# Patient Record
Sex: Female | Born: 1935 | Race: White | Hispanic: No
Health system: Southern US, Community
[De-identification: ages and names within clinical notes are randomized; demographics above are authoritative.]

---

## 2004-11-06 ENCOUNTER — Ambulatory Visit: Payer: Self-pay

## 2010-08-20 ENCOUNTER — Emergency Department: Payer: Self-pay | Admitting: Emergency Medicine

## 2010-10-06 ENCOUNTER — Ambulatory Visit: Payer: Self-pay | Admitting: Oncology

## 2010-10-10 ENCOUNTER — Ambulatory Visit: Payer: Self-pay | Admitting: Anesthesiology

## 2010-10-14 ENCOUNTER — Ambulatory Visit: Payer: Self-pay | Admitting: Urology

## 2010-10-16 ENCOUNTER — Ambulatory Visit: Payer: Self-pay | Admitting: Oncology

## 2010-10-23 ENCOUNTER — Ambulatory Visit: Payer: Self-pay | Admitting: Oncology

## 2010-11-06 ENCOUNTER — Ambulatory Visit: Payer: Self-pay | Admitting: Oncology

## 2010-12-05 ENCOUNTER — Ambulatory Visit: Payer: Self-pay | Admitting: Vascular Surgery

## 2010-12-05 ENCOUNTER — Ambulatory Visit: Payer: Self-pay | Admitting: Oncology

## 2010-12-10 ENCOUNTER — Inpatient Hospital Stay: Payer: Self-pay | Admitting: Internal Medicine

## 2010-12-17 ENCOUNTER — Ambulatory Visit: Payer: Self-pay | Admitting: Oncology

## 2011-01-05 ENCOUNTER — Ambulatory Visit: Payer: Self-pay | Admitting: Oncology

## 2011-02-04 ENCOUNTER — Ambulatory Visit: Payer: Self-pay | Admitting: Oncology

## 2011-03-07 ENCOUNTER — Ambulatory Visit: Payer: Self-pay | Admitting: Oncology

## 2011-03-17 ENCOUNTER — Ambulatory Visit: Payer: Self-pay | Admitting: Vascular Surgery

## 2011-04-06 ENCOUNTER — Ambulatory Visit: Payer: Self-pay | Admitting: Oncology

## 2011-05-07 ENCOUNTER — Ambulatory Visit: Payer: Self-pay | Admitting: Oncology

## 2011-06-07 ENCOUNTER — Ambulatory Visit: Payer: Self-pay | Admitting: Oncology

## 2011-07-07 ENCOUNTER — Ambulatory Visit: Payer: Self-pay | Admitting: Oncology

## 2011-08-07 ENCOUNTER — Ambulatory Visit: Payer: Self-pay | Admitting: Oncology

## 2011-09-06 ENCOUNTER — Ambulatory Visit: Payer: Self-pay | Admitting: Oncology

## 2011-10-07 ENCOUNTER — Ambulatory Visit: Payer: Self-pay | Admitting: Oncology

## 2011-10-24 LAB — URINALYSIS, COMPLETE
Bacteria: NEGATIVE
Glucose,UR: NEGATIVE mg/dL (ref 0–75)
Leukocyte Esterase: NEGATIVE
Nitrite: NEGATIVE

## 2011-10-24 LAB — COMPREHENSIVE METABOLIC PANEL
Albumin: 3.6 g/dL (ref 3.4–5.0)
Anion Gap: 8 (ref 7–16)
BUN: 13 mg/dL (ref 7–18)
Calcium, Total: 9.2 mg/dL (ref 8.5–10.1)
EGFR (Non-African Amer.): 54 — ABNORMAL LOW
Glucose: 90 mg/dL (ref 65–99)
Osmolality: 281 (ref 275–301)
Potassium: 4 mmol/L (ref 3.5–5.1)
SGOT(AST): 20 U/L (ref 15–37)
SGPT (ALT): 15 U/L
Sodium: 141 mmol/L (ref 136–145)

## 2011-10-24 LAB — CBC CANCER CENTER
Basophil %: 0.4 %
Eosinophil #: 0.1 x10 3/mm (ref 0.0–0.7)
Eosinophil %: 1.6 %
HCT: 38.1 % (ref 35.0–47.0)
HGB: 12.7 g/dL (ref 12.0–16.0)
Lymphocyte #: 1.5 x10 3/mm (ref 1.0–3.6)
Lymphocyte %: 28.3 %
MCH: 32.1 pg (ref 26.0–34.0)
MCV: 95.9 fL (ref 80–100)
Monocyte #: 0.5 x10 3/mm (ref 0.0–0.7)
Monocyte %: 8.8 %
Neutrophil #: 3.2 x10 3/mm (ref 1.4–6.5)
RBC: 3.97 10*6/uL (ref 3.80–5.20)
RDW: 13.3 % (ref 11.5–14.5)
WBC: 5.3 x10 3/mm (ref 3.6–11.0)

## 2011-11-07 ENCOUNTER — Ambulatory Visit: Payer: Self-pay | Admitting: Oncology

## 2011-11-14 LAB — COMPREHENSIVE METABOLIC PANEL
Alkaline Phosphatase: 90 U/L (ref 50–136)
Anion Gap: 6 — ABNORMAL LOW (ref 7–16)
Calcium, Total: 8.6 mg/dL (ref 8.5–10.1)
Chloride: 103 mmol/L (ref 98–107)
Co2: 30 mmol/L (ref 21–32)
Creatinine: 1.16 mg/dL (ref 0.60–1.30)
EGFR (African American): 59 — ABNORMAL LOW
EGFR (Non-African Amer.): 48 — ABNORMAL LOW
Glucose: 137 mg/dL — ABNORMAL HIGH (ref 65–99)
SGPT (ALT): 17 U/L

## 2011-11-14 LAB — CBC CANCER CENTER
Basophil %: 0.2 %
Eosinophil %: 1.8 %
HCT: 40.6 % (ref 35.0–47.0)
HGB: 13.2 g/dL (ref 12.0–16.0)
Lymphocyte #: 1 x10 3/mm (ref 1.0–3.6)
MCV: 94.3 fL (ref 80–100)
Monocyte %: 7.6 %
Neutrophil #: 4.2 x10 3/mm (ref 1.4–6.5)
RBC: 4.3 10*6/uL (ref 3.80–5.20)
WBC: 5.7 x10 3/mm (ref 3.6–11.0)

## 2011-12-05 ENCOUNTER — Ambulatory Visit: Payer: Self-pay | Admitting: Oncology

## 2011-12-09 ENCOUNTER — Inpatient Hospital Stay: Payer: Self-pay | Admitting: Oncology

## 2011-12-09 LAB — URINALYSIS, COMPLETE
Bacteria: NONE SEEN
Bilirubin,UR: NEGATIVE
Leukocyte Esterase: NEGATIVE
Ph: 5 (ref 4.5–8.0)
Protein: 100
RBC,UR: 1790 /HPF (ref 0–5)
Specific Gravity: 1.027 (ref 1.003–1.030)
Squamous Epithelial: NONE SEEN

## 2011-12-09 LAB — CBC CANCER CENTER
Basophil %: 0.3 %
Eosinophil #: 0.1 x10 3/mm (ref 0.0–0.7)
HCT: 36.2 % (ref 35.0–47.0)
HGB: 12.2 g/dL (ref 12.0–16.0)
Lymphocyte %: 16 %
MCHC: 33.6 g/dL (ref 32.0–36.0)
Monocyte #: 0.7 x10 3/mm (ref 0.0–0.7)
Monocyte %: 9.8 %
Neutrophil %: 72.5 %
Platelet: 328 x10 3/mm (ref 150–440)
RBC: 4.06 10*6/uL (ref 3.80–5.20)

## 2011-12-09 LAB — COMPREHENSIVE METABOLIC PANEL
Anion Gap: 11 (ref 7–16)
BUN: 20 mg/dL — ABNORMAL HIGH (ref 7–18)
Bilirubin,Total: 0.5 mg/dL (ref 0.2–1.0)
Chloride: 101 mmol/L (ref 98–107)
Co2: 29 mmol/L (ref 21–32)
Creatinine: 1.48 mg/dL — ABNORMAL HIGH (ref 0.60–1.30)
Osmolality: 283 (ref 275–301)
Potassium: 3.4 mmol/L — ABNORMAL LOW (ref 3.5–5.1)
Sodium: 141 mmol/L (ref 136–145)
Total Protein: 6.8 g/dL (ref 6.4–8.2)

## 2011-12-10 LAB — PROTIME-INR: INR: 1.1

## 2011-12-10 LAB — CBC WITH DIFFERENTIAL/PLATELET
Eosinophil %: 0.1 %
HCT: 35.2 % (ref 35.0–47.0)
HGB: 11.9 g/dL — ABNORMAL LOW (ref 12.0–16.0)
Lymphocyte #: 0.6 10*3/uL — ABNORMAL LOW (ref 1.0–3.6)
Lymphocyte %: 10.3 %
MCH: 29.8 pg (ref 26.0–34.0)
MCV: 89 fL (ref 80–100)
Monocyte %: 1.6 %
Neutrophil #: 4.8 10*3/uL (ref 1.4–6.5)
RBC: 3.98 10*6/uL (ref 3.80–5.20)
RDW: 13.9 % (ref 11.5–14.5)
WBC: 5.5 10*3/uL (ref 3.6–11.0)

## 2011-12-10 LAB — BASIC METABOLIC PANEL
Anion Gap: 15 (ref 7–16)
BUN: 20 mg/dL — ABNORMAL HIGH (ref 7–18)
Calcium, Total: 8.3 mg/dL — ABNORMAL LOW (ref 8.5–10.1)
Co2: 24 mmol/L (ref 21–32)
Creatinine: 1.44 mg/dL — ABNORMAL HIGH (ref 0.60–1.30)
EGFR (African American): 46 — ABNORMAL LOW
EGFR (Non-African Amer.): 38 — ABNORMAL LOW
Glucose: 128 mg/dL — ABNORMAL HIGH (ref 65–99)
Osmolality: 287 (ref 275–301)
Sodium: 142 mmol/L (ref 136–145)

## 2011-12-10 LAB — APTT: Activated PTT: 34 secs (ref 23.6–35.9)

## 2011-12-12 LAB — CBC WITH DIFFERENTIAL/PLATELET
Basophil %: 0.1 %
Eosinophil #: 0.1 10*3/uL (ref 0.0–0.7)
HCT: 31.3 % — ABNORMAL LOW (ref 35.0–47.0)
HGB: 10.5 g/dL — ABNORMAL LOW (ref 12.0–16.0)
MCH: 30 pg (ref 26.0–34.0)
MCHC: 33.4 g/dL (ref 32.0–36.0)
MCV: 90 fL (ref 80–100)
Monocyte #: 0.9 10*3/uL — ABNORMAL HIGH (ref 0.0–0.7)
Neutrophil #: 5.4 10*3/uL (ref 1.4–6.5)
Neutrophil %: 75.9 %
RDW: 14 % (ref 11.5–14.5)

## 2011-12-12 LAB — BASIC METABOLIC PANEL
Chloride: 110 mmol/L — ABNORMAL HIGH (ref 98–107)
Co2: 28 mmol/L (ref 21–32)
Glucose: 86 mg/dL (ref 65–99)
Potassium: 3.1 mmol/L — ABNORMAL LOW (ref 3.5–5.1)
Sodium: 144 mmol/L (ref 136–145)

## 2011-12-13 LAB — BASIC METABOLIC PANEL
Calcium, Total: 7.9 mg/dL — ABNORMAL LOW (ref 8.5–10.1)
Chloride: 107 mmol/L (ref 98–107)
Co2: 26 mmol/L (ref 21–32)
Creatinine: 1.18 mg/dL (ref 0.60–1.30)
EGFR (Non-African Amer.): 47 — ABNORMAL LOW
Osmolality: 292 (ref 275–301)
Potassium: 3.1 mmol/L — ABNORMAL LOW (ref 3.5–5.1)
Sodium: 146 mmol/L — ABNORMAL HIGH (ref 136–145)

## 2011-12-15 ENCOUNTER — Ambulatory Visit: Payer: Self-pay | Admitting: Oncology

## 2011-12-19 LAB — COMPREHENSIVE METABOLIC PANEL
Alkaline Phosphatase: 91 U/L (ref 50–136)
Anion Gap: 7 (ref 7–16)
BUN: 12 mg/dL (ref 7–18)
Co2: 34 mmol/L — ABNORMAL HIGH (ref 21–32)
Creatinine: 0.95 mg/dL (ref 0.60–1.30)
Osmolality: 277 (ref 275–301)
SGPT (ALT): 19 U/L
Total Protein: 5.7 g/dL — ABNORMAL LOW (ref 6.4–8.2)

## 2011-12-19 LAB — CBC CANCER CENTER
Basophil #: 0 x10 3/mm (ref 0.0–0.1)
Eosinophil #: 0.1 x10 3/mm (ref 0.0–0.7)
HCT: 33.4 % — ABNORMAL LOW (ref 35.0–47.0)
Lymphocyte #: 0.6 x10 3/mm — ABNORMAL LOW (ref 1.0–3.6)
MCH: 29.8 pg (ref 26.0–34.0)
MCHC: 33.7 g/dL (ref 32.0–36.0)
MCV: 88.3 fL (ref 80–100)
Monocyte #: 0.6 x10 3/mm (ref 0.0–0.7)
Monocyte %: 9.2 %
Neutrophil #: 5.7 x10 3/mm (ref 1.4–6.5)
RBC: 3.78 10*6/uL — ABNORMAL LOW (ref 3.80–5.20)
RDW: 13.8 % (ref 11.5–14.5)
WBC: 7 x10 3/mm (ref 3.6–11.0)

## 2011-12-26 LAB — CBC CANCER CENTER
Eosinophil #: 0.2 x10 3/mm (ref 0.0–0.7)
Eosinophil %: 2.7 %
HCT: 32.9 % — ABNORMAL LOW (ref 35.0–47.0)
Lymphocyte #: 0.4 x10 3/mm — ABNORMAL LOW (ref 1.0–3.6)
MCH: 29.7 pg (ref 26.0–34.0)
Monocyte #: 0 x10 3/mm (ref 0.0–0.7)
Neutrophil #: 6.5 x10 3/mm (ref 1.4–6.5)
Neutrophil %: 90.5 %
Platelet: 130 x10 3/mm — ABNORMAL LOW (ref 150–440)
RBC: 3.68 10*6/uL — ABNORMAL LOW (ref 3.80–5.20)

## 2011-12-26 LAB — URINALYSIS, COMPLETE
Bilirubin,UR: NEGATIVE
Ph: 8 (ref 4.5–8.0)
Protein: 100

## 2011-12-26 LAB — COMPREHENSIVE METABOLIC PANEL
BUN: 20 mg/dL — ABNORMAL HIGH (ref 7–18)
Bilirubin,Total: 0.6 mg/dL (ref 0.2–1.0)
Creatinine: 1.07 mg/dL (ref 0.60–1.30)
EGFR (African American): >60
EGFR (Non-African Amer.): 53 — ABNORMAL LOW
Glucose: 138 mg/dL — ABNORMAL HIGH (ref 65–99)
Osmolality: 282 (ref 275–301)
Potassium: 3.3 mmol/L — ABNORMAL LOW (ref 3.5–5.1)
SGOT(AST): 28 U/L (ref 15–37)
SGPT (ALT): 32 U/L
Sodium: 139 mmol/L (ref 136–145)
Total Protein: 5.6 g/dL — ABNORMAL LOW (ref 6.4–8.2)

## 2011-12-26 LAB — MAGNESIUM: Magnesium: 1.6 mg/dL — ABNORMAL LOW

## 2011-12-29 LAB — CBC CANCER CENTER
Basophil #: 0.1 x10 3/mm (ref 0.0–0.1)
Eosinophil %: 3.5 %
HCT: 27.5 % — ABNORMAL LOW (ref 35.0–47.0)
Lymphocyte %: 15.7 %
MCHC: 33.4 g/dL (ref 32.0–36.0)
MCV: 87.6 fL (ref 80–100)
Monocyte #: 0.3 x10 3/mm (ref 0.0–0.7)
Neutrophil #: 2.4 x10 3/mm (ref 1.4–6.5)
Neutrophil %: 71 %
Platelet: 107 x10 3/mm — ABNORMAL LOW (ref 150–440)
WBC: 3.4 x10 3/mm — ABNORMAL LOW (ref 3.6–11.0)

## 2012-01-05 ENCOUNTER — Ambulatory Visit: Payer: Self-pay | Admitting: Oncology

## 2012-01-06 LAB — HEPATIC FUNCTION PANEL A (ARMC)
Albumin: 2.7 g/dL — ABNORMAL LOW (ref 3.4–5.0)
Alkaline Phosphatase: 121 U/L (ref 50–136)
Bilirubin, Direct: 0.1 mg/dL (ref 0.00–0.20)
SGPT (ALT): 19 U/L
Total Protein: 6 g/dL — ABNORMAL LOW (ref 6.4–8.2)

## 2012-01-06 LAB — BASIC METABOLIC PANEL
BUN: 17 mg/dL (ref 7–18)
Calcium, Total: 8.4 mg/dL — ABNORMAL LOW (ref 8.5–10.1)
Chloride: 101 mmol/L (ref 98–107)
Co2: 32 mmol/L (ref 21–32)
EGFR (African American): >60
EGFR (Non-African Amer.): 57 — ABNORMAL LOW
Osmolality: 277 (ref 275–301)
Potassium: 3.7 mmol/L (ref 3.5–5.1)
Sodium: 138 mmol/L (ref 136–145)

## 2012-01-06 LAB — CBC CANCER CENTER
Basophil %: 1.1 %
Eosinophil #: 0 x10 3/mm (ref 0.0–0.7)
Eosinophil %: 0.4 %
HCT: 25.6 % — ABNORMAL LOW (ref 35.0–47.0)
MCH: 29.5 pg (ref 26.0–34.0)
Monocyte #: 0.5 x10 3/mm (ref 0.0–0.7)
Monocyte %: 15.9 %
Neutrophil %: 60.9 %
RBC: 2.92 10*6/uL — ABNORMAL LOW (ref 3.80–5.20)
WBC: 2.9 x10 3/mm — ABNORMAL LOW (ref 3.6–11.0)

## 2012-01-07 ENCOUNTER — Ambulatory Visit: Payer: Self-pay | Admitting: Oncology

## 2012-01-16 LAB — CBC CANCER CENTER
Basophil #: 0 x10 3/mm (ref 0.0–0.1)
Basophil %: 0.6 %
HGB: 8.3 g/dL — ABNORMAL LOW (ref 12.0–16.0)
Lymphocyte #: 0.8 x10 3/mm — ABNORMAL LOW (ref 1.0–3.6)
Monocyte #: 0.7 x10 3/mm (ref 0.2–0.9)
Neutrophil %: 68.8 %
Platelet: 188 x10 3/mm (ref 150–440)
RBC: 2.79 10*6/uL — ABNORMAL LOW (ref 3.80–5.20)
RDW: 18.3 % — ABNORMAL HIGH (ref 11.5–14.5)

## 2012-01-16 LAB — COMPREHENSIVE METABOLIC PANEL
Calcium, Total: 9 mg/dL (ref 8.5–10.1)
Chloride: 101 mmol/L (ref 98–107)
Co2: 28 mmol/L (ref 21–32)
Creatinine: 0.95 mg/dL (ref 0.60–1.30)
EGFR (African American): >60
SGOT(AST): 13 U/L — ABNORMAL LOW (ref 15–37)
SGPT (ALT): 15 U/L
Sodium: 136 mmol/L (ref 136–145)
Total Protein: 6.1 g/dL — ABNORMAL LOW (ref 6.4–8.2)

## 2012-01-23 LAB — CBC CANCER CENTER
Basophil #: 0 x10 3/mm (ref 0.0–0.1)
Basophil %: 1.5 %
Eosinophil #: 0.1 x10 3/mm (ref 0.0–0.7)
Eosinophil %: 1.8 %
HGB: 8.3 g/dL — ABNORMAL LOW (ref 12.0–16.0)
Lymphocyte #: 0.7 x10 3/mm — ABNORMAL LOW (ref 1.0–3.6)
MCH: 29.7 pg (ref 26.0–34.0)
MCHC: 33.2 g/dL (ref 32.0–36.0)
MCV: 90 fL (ref 80–100)
Monocyte #: 0.2 x10 3/mm (ref 0.2–0.9)
Monocyte %: 4.5 %
Neutrophil #: 2.4 x10 3/mm (ref 1.4–6.5)
Platelet: 151 x10 3/mm (ref 150–440)
RBC: 2.78 10*6/uL — ABNORMAL LOW (ref 3.80–5.20)
RDW: 18.3 % — ABNORMAL HIGH (ref 11.5–14.5)

## 2012-01-23 LAB — BASIC METABOLIC PANEL
BUN: 19 mg/dL — ABNORMAL HIGH (ref 7–18)
Co2: 29 mmol/L (ref 21–32)
Creatinine: 0.9 mg/dL (ref 0.60–1.30)
EGFR (African American): >60
EGFR (Non-African Amer.): >60
Osmolality: 270 (ref 275–301)
Sodium: 132 mmol/L — ABNORMAL LOW (ref 136–145)

## 2012-02-04 ENCOUNTER — Ambulatory Visit: Payer: Self-pay | Admitting: Oncology

## 2012-02-06 LAB — CBC CANCER CENTER
Lymphocyte #: 0.9 x10 3/mm — ABNORMAL LOW (ref 1.0–3.6)
Monocyte #: 0.7 x10 3/mm (ref 0.2–0.9)
Monocyte %: 15.4 %
Neutrophil #: 2.8 x10 3/mm (ref 1.4–6.5)
Platelet: 124 x10 3/mm — ABNORMAL LOW (ref 150–440)
RBC: 2.54 10*6/uL — ABNORMAL LOW (ref 3.80–5.20)

## 2012-02-06 LAB — COMPREHENSIVE METABOLIC PANEL
Albumin: 2.6 g/dL — ABNORMAL LOW (ref 3.4–5.0)
Alkaline Phosphatase: 102 U/L (ref 50–136)
Anion Gap: 6 — ABNORMAL LOW (ref 7–16)
BUN: 9 mg/dL (ref 7–18)
Creatinine: 0.78 mg/dL (ref 0.60–1.30)
Glucose: 99 mg/dL (ref 65–99)
SGPT (ALT): 14 U/L
Sodium: 135 mmol/L — ABNORMAL LOW (ref 136–145)
Total Protein: 5.8 g/dL — ABNORMAL LOW (ref 6.4–8.2)

## 2012-02-13 LAB — CBC CANCER CENTER
Basophil %: 0.5 %
Eosinophil #: 0.1 x10 3/mm (ref 0.0–0.7)
HCT: 33.3 % — ABNORMAL LOW (ref 35.0–47.0)
HGB: 10.8 g/dL — ABNORMAL LOW (ref 12.0–16.0)
Lymphocyte #: 0.8 x10 3/mm — ABNORMAL LOW (ref 1.0–3.6)
MCH: 30.2 pg (ref 26.0–34.0)
MCHC: 32.4 g/dL (ref 32.0–36.0)
MCV: 93 fL (ref 80–100)
Monocyte #: 0.5 x10 3/mm (ref 0.2–0.9)
Monocyte %: 9.6 %
Neutrophil #: 3.9 x10 3/mm (ref 1.4–6.5)
Neutrophil %: 73.7 %
Platelet: 207 x10 3/mm (ref 150–440)
RDW: 21.3 % — ABNORMAL HIGH (ref 11.5–14.5)
WBC: 5.3 x10 3/mm (ref 3.6–11.0)

## 2012-02-13 LAB — BASIC METABOLIC PANEL
Anion Gap: 6 — ABNORMAL LOW (ref 7–16)
Calcium, Total: 8.8 mg/dL (ref 8.5–10.1)
Chloride: 100 mmol/L (ref 98–107)
EGFR (African American): >60
EGFR (Non-African Amer.): 53 — ABNORMAL LOW
Glucose: 115 mg/dL — ABNORMAL HIGH (ref 65–99)
Osmolality: 274 (ref 275–301)
Potassium: 4.1 mmol/L (ref 3.5–5.1)
Sodium: 136 mmol/L (ref 136–145)

## 2012-02-20 LAB — CBC CANCER CENTER
Basophil %: 0.4 %
HCT: 30.8 % — ABNORMAL LOW (ref 35.0–47.0)
Lymphocyte #: 0.7 x10 3/mm — ABNORMAL LOW (ref 1.0–3.6)
Lymphocyte %: 16.3 %
MCH: 30.6 pg (ref 26.0–34.0)
MCHC: 33.3 g/dL (ref 32.0–36.0)
Monocyte #: 0.3 x10 3/mm (ref 0.2–0.9)
Monocyte %: 7.4 %
Neutrophil #: 3 x10 3/mm (ref 1.4–6.5)
Neutrophil %: 74.5 %
WBC: 4.1 x10 3/mm (ref 3.6–11.0)

## 2012-02-27 LAB — CBC CANCER CENTER
Basophil %: 0.5 %
Eosinophil %: 0.4 %
HCT: 29.4 % — ABNORMAL LOW (ref 35.0–47.0)
HGB: 9.8 g/dL — ABNORMAL LOW (ref 12.0–16.0)
Lymphocyte #: 0.5 x10 3/mm — ABNORMAL LOW (ref 1.0–3.6)
Lymphocyte %: 20.3 %
MCH: 30.8 pg (ref 26.0–34.0)
MCHC: 33.4 g/dL (ref 32.0–36.0)
MCV: 92 fL (ref 80–100)
Monocyte #: 0.4 x10 3/mm (ref 0.2–0.9)
Monocyte %: 15.8 %
Neutrophil #: 1.7 x10 3/mm (ref 1.4–6.5)
RBC: 3.18 10*6/uL — ABNORMAL LOW (ref 3.80–5.20)
RDW: 19.5 % — ABNORMAL HIGH (ref 11.5–14.5)
WBC: 2.6 x10 3/mm — ABNORMAL LOW (ref 3.6–11.0)

## 2012-02-27 LAB — HEPATIC FUNCTION PANEL A (ARMC)
Albumin: 2.7 g/dL — ABNORMAL LOW (ref 3.4–5.0)
Alkaline Phosphatase: 75 U/L (ref 50–136)
Bilirubin,Total: 0.4 mg/dL (ref 0.2–1.0)
Total Protein: 6.1 g/dL — ABNORMAL LOW (ref 6.4–8.2)

## 2012-02-27 LAB — BASIC METABOLIC PANEL
Anion Gap: 6 — ABNORMAL LOW (ref 7–16)
BUN: 12 mg/dL (ref 7–18)
Calcium, Total: 8.7 mg/dL (ref 8.5–10.1)
Chloride: 99 mmol/L (ref 98–107)
Creatinine: 0.81 mg/dL (ref 0.60–1.30)
EGFR (African American): >60
EGFR (Non-African Amer.): >60
Osmolality: 272 (ref 275–301)

## 2012-03-05 LAB — CBC CANCER CENTER
Eosinophil #: 0 x10 3/mm (ref 0.0–0.7)
Eosinophil %: 0.2 %
HCT: 27.7 % — ABNORMAL LOW (ref 35.0–47.0)
Lymphocyte #: 0.5 x10 3/mm — ABNORMAL LOW (ref 1.0–3.6)
MCHC: 32.7 g/dL (ref 32.0–36.0)
Monocyte #: 0.7 x10 3/mm (ref 0.2–0.9)
Monocyte %: 14 %
Neutrophil %: 74.9 %
Platelet: 138 x10 3/mm — ABNORMAL LOW (ref 150–440)
RBC: 2.98 10*6/uL — ABNORMAL LOW (ref 3.80–5.20)
RDW: 20.3 % — ABNORMAL HIGH (ref 11.5–14.5)
WBC: 5.1 x10 3/mm (ref 3.6–11.0)

## 2012-03-05 LAB — BASIC METABOLIC PANEL
Calcium, Total: 8.6 mg/dL (ref 8.5–10.1)
Creatinine: 0.83 mg/dL (ref 0.60–1.30)
EGFR (African American): >60
EGFR (Non-African Amer.): >60
Glucose: 110 mg/dL — ABNORMAL HIGH (ref 65–99)
Sodium: 135 mmol/L — ABNORMAL LOW (ref 136–145)

## 2012-03-05 LAB — PROTIME-INR
INR: 1.1
Prothrombin Time: 14.8 secs — ABNORMAL HIGH (ref 11.5–14.7)

## 2012-03-06 ENCOUNTER — Ambulatory Visit: Payer: Self-pay | Admitting: Oncology

## 2012-03-08 ENCOUNTER — Ambulatory Visit: Payer: Self-pay | Admitting: Oncology

## 2012-03-11 ENCOUNTER — Ambulatory Visit: Payer: Self-pay | Admitting: Oncology

## 2012-03-12 LAB — CBC CANCER CENTER
Basophil #: 0 x10 3/mm (ref 0.0–0.1)
Eosinophil %: 0.2 %
HCT: 25.4 % — ABNORMAL LOW (ref 35.0–47.0)
Lymphocyte %: 14.3 %
MCV: 92 fL (ref 80–100)
Monocyte #: 0.2 x10 3/mm (ref 0.2–0.9)
Neutrophil #: 4.1 x10 3/mm (ref 1.4–6.5)
Neutrophil %: 81.6 %
RBC: 2.76 10*6/uL — ABNORMAL LOW (ref 3.80–5.20)
RDW: 19.9 % — ABNORMAL HIGH (ref 11.5–14.5)

## 2012-03-19 LAB — CBC CANCER CENTER
Eosinophil %: 0.3 %
HCT: 24.5 % — ABNORMAL LOW (ref 35.0–47.0)
Lymphocyte #: 0.3 x10 3/mm — ABNORMAL LOW (ref 1.0–3.6)
Lymphocyte %: 17.4 %
MCH: 30.9 pg (ref 26.0–34.0)
MCHC: 33.2 g/dL (ref 32.0–36.0)
Neutrophil #: 1.2 x10 3/mm — ABNORMAL LOW (ref 1.4–6.5)
Platelet: 165 x10 3/mm (ref 150–440)

## 2012-03-19 LAB — BASIC METABOLIC PANEL
BUN: 12 mg/dL (ref 7–18)
Calcium, Total: 8.6 mg/dL (ref 8.5–10.1)
Chloride: 97 mmol/L — ABNORMAL LOW (ref 98–107)
Co2: 30 mmol/L (ref 21–32)
EGFR (Non-African Amer.): >60
Sodium: 135 mmol/L — ABNORMAL LOW (ref 136–145)

## 2012-03-19 LAB — HEPATIC FUNCTION PANEL A (ARMC)
Alkaline Phosphatase: 67 U/L (ref 50–136)
SGOT(AST): 16 U/L (ref 15–37)
Total Protein: 5.8 g/dL — ABNORMAL LOW (ref 6.4–8.2)

## 2012-03-26 LAB — CBC CANCER CENTER
Basophil #: 0 x10 3/mm (ref 0.0–0.1)
Eosinophil #: 0 x10 3/mm (ref 0.0–0.7)
Eosinophil %: 0.1 %
HCT: 23.3 % — ABNORMAL LOW (ref 35.0–47.0)
HGB: 7.6 g/dL — ABNORMAL LOW (ref 12.0–16.0)
Lymphocyte #: 0.6 x10 3/mm — ABNORMAL LOW (ref 1.0–3.6)
MCH: 30.8 pg (ref 26.0–34.0)
Monocyte #: 0.7 x10 3/mm (ref 0.2–0.9)
Neutrophil #: 4.6 x10 3/mm (ref 1.4–6.5)
RBC: 2.48 10*6/uL — ABNORMAL LOW (ref 3.80–5.20)
RDW: 20.9 % — ABNORMAL HIGH (ref 11.5–14.5)

## 2012-03-26 LAB — COMPREHENSIVE METABOLIC PANEL
Albumin: 2.5 g/dL — ABNORMAL LOW (ref 3.4–5.0)
Alkaline Phosphatase: 89 U/L (ref 50–136)
Anion Gap: 9 (ref 7–16)
BUN: 13 mg/dL (ref 7–18)
Chloride: 96 mmol/L — ABNORMAL LOW (ref 98–107)
Co2: 27 mmol/L (ref 21–32)
Creatinine: 1.15 mg/dL (ref 0.60–1.30)
EGFR (African American): 53 — ABNORMAL LOW
EGFR (Non-African Amer.): 46 — ABNORMAL LOW
Glucose: 118 mg/dL — ABNORMAL HIGH (ref 65–99)
Osmolality: 266 (ref 275–301)
Potassium: 3.6 mmol/L (ref 3.5–5.1)
SGOT(AST): 12 U/L — ABNORMAL LOW (ref 15–37)
SGPT (ALT): 15 U/L
Sodium: 132 mmol/L — ABNORMAL LOW (ref 136–145)

## 2012-04-05 ENCOUNTER — Ambulatory Visit: Payer: Self-pay | Admitting: Oncology

## 2012-04-12 LAB — CBC CANCER CENTER
Basophil #: 0 x10 3/mm (ref 0.0–0.1)
Eosinophil #: 0 x10 3/mm (ref 0.0–0.7)
Eosinophil %: 0 %
HCT: 28.3 % — ABNORMAL LOW (ref 35.0–47.0)
Lymphocyte %: 6.5 %
Monocyte %: 9 %
Neutrophil #: 14.2 x10 3/mm — ABNORMAL HIGH (ref 1.4–6.5)
Neutrophil %: 84.4 %
Platelet: 100 x10 3/mm — ABNORMAL LOW (ref 150–440)
RDW: 18.7 % — ABNORMAL HIGH (ref 11.5–14.5)
WBC: 16.8 x10 3/mm — ABNORMAL HIGH (ref 3.6–11.0)

## 2012-04-12 LAB — COMPREHENSIVE METABOLIC PANEL
Albumin: 2.9 g/dL — ABNORMAL LOW (ref 3.4–5.0)
Alkaline Phosphatase: 111 U/L (ref 50–136)
Anion Gap: 11 (ref 7–16)
Bilirubin,Total: 0.4 mg/dL (ref 0.2–1.0)
Calcium, Total: 9 mg/dL (ref 8.5–10.1)
Chloride: 90 mmol/L — ABNORMAL LOW (ref 98–107)
Co2: 26 mmol/L (ref 21–32)
Creatinine: 2.78 mg/dL — ABNORMAL HIGH (ref 0.60–1.30)
EGFR (African American): 18 — ABNORMAL LOW
EGFR (Non-African Amer.): 16 — ABNORMAL LOW
Osmolality: 265 (ref 275–301)
Potassium: 4.4 mmol/L (ref 3.5–5.1)
Sodium: 127 mmol/L — ABNORMAL LOW (ref 136–145)

## 2012-04-12 LAB — PROTIME-INR
INR: 1.2
Prothrombin Time: 15.6 secs — ABNORMAL HIGH (ref 11.5–14.7)

## 2012-05-06 ENCOUNTER — Ambulatory Visit: Payer: Self-pay | Admitting: Oncology

## 2012-05-06 DEATH — deceased

## 2012-09-19 IMAGING — XA IR NEPHROSTOMY TUBE CHANGE
3 series · 3 of 3 positions shown · non-contrast
Comparison: none

REASON FOR EXAM: per Radiologist Hydronephrosis
COMMENTS:

[Series 2: single · 1 of 1 slices shown (1 of 2)]
[im 1/1]
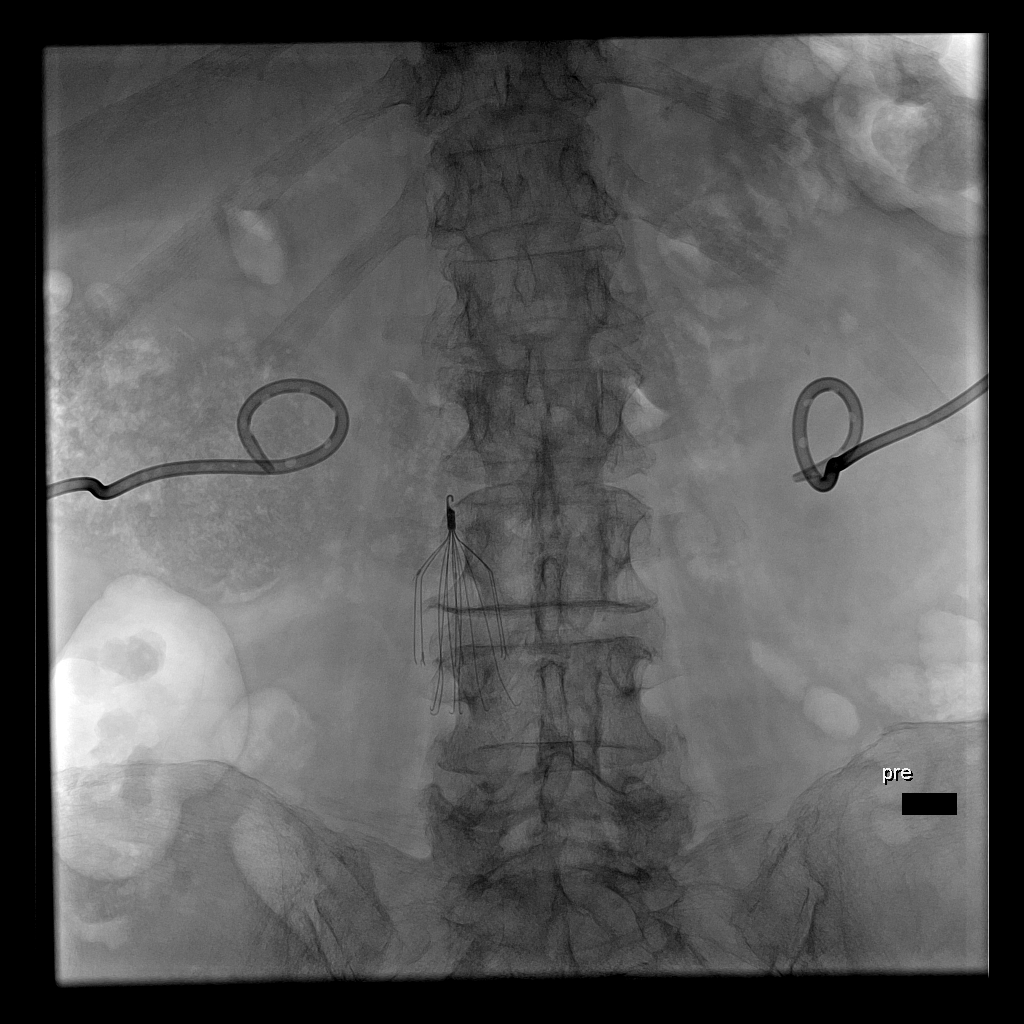

[Series 3: single · 1 of 1 slices shown (2 of 2)]
[im 1/1]
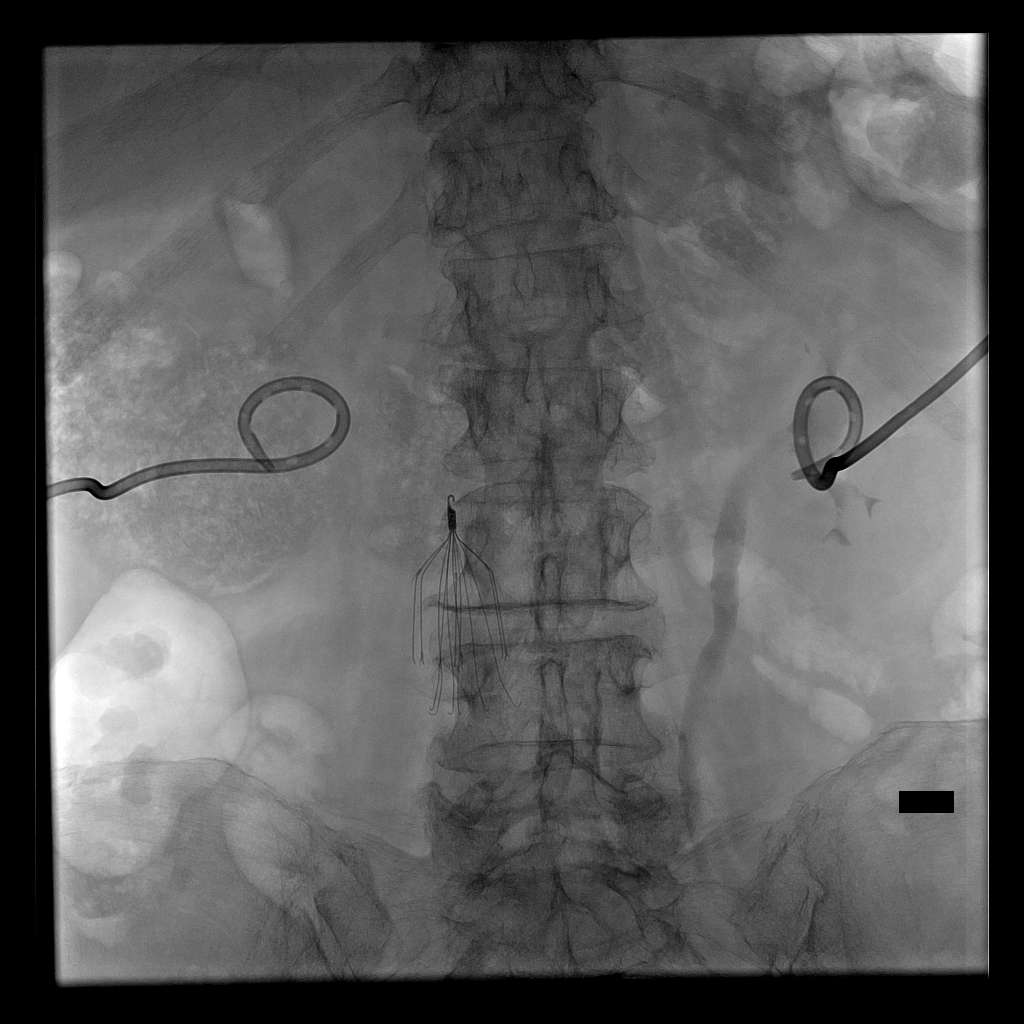

[Series 5: fl - angio · 1 of 1 slices shown]
[im 1/1]
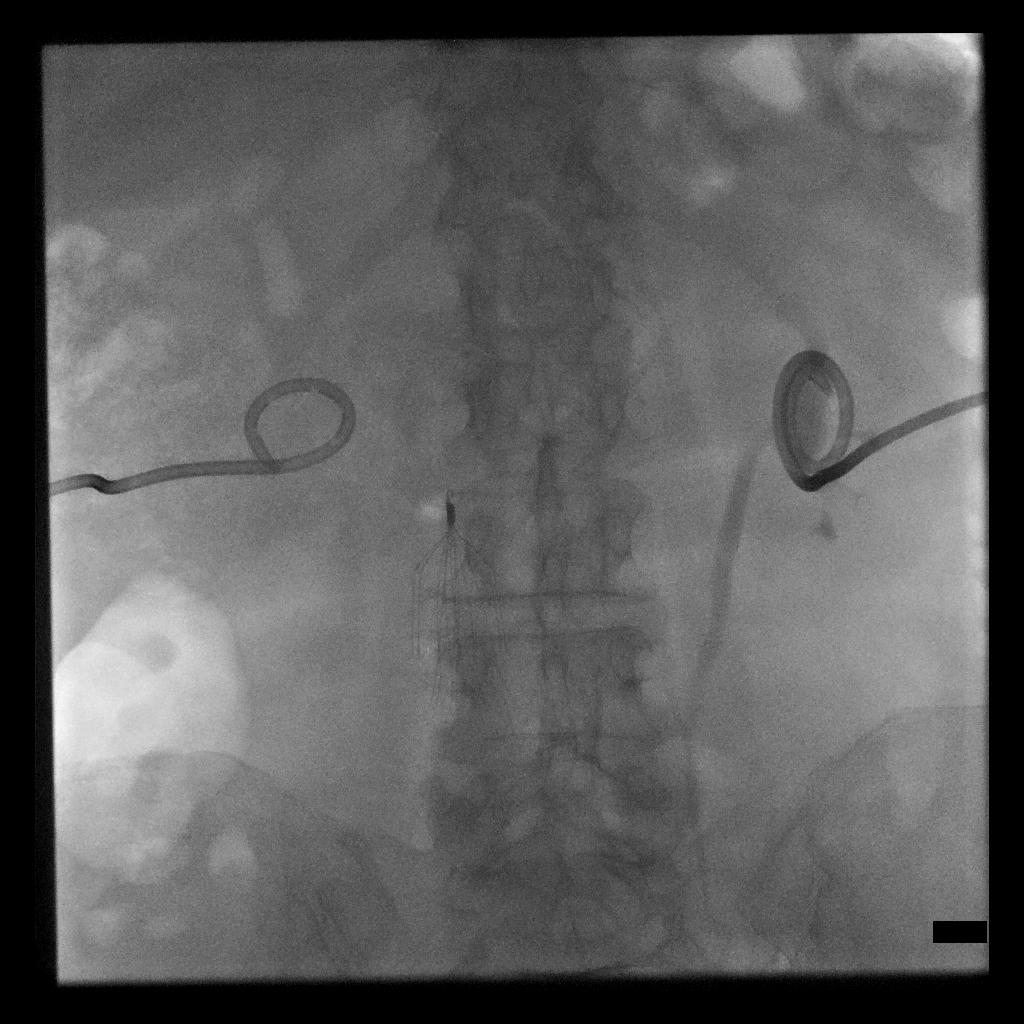

[3 of 3 positions shown; findings below may reference images not displayed]

PROCEDURE:     VAS - CHG NEPHROSTOMY/PYELOSTOMY TUBE  - March 11, 2012  [DATE]

RESULT:

PROCEDURE:  The patient was informed of the risks and benefits of the
procedure and proper informed consent was obtained. The patient was brought
to the [facility] and placed in a prone position. The left
nephrostomy tube is identified. The overlying soft tissues were prepped and
draped in the usual sterile fashion. The patient received conscious sedation
via IV doses of 50 mcg of Fentanyl and 2 mg of Versed. The Versed was
administered at [DATE] and the Fentanyl at [DATE]. The 10 French
nephrostomy tube was uncoped. An antegrade urogram was performed status post
injection of a solution of 10 ml sterile saline and 5 mm radiopaque
contrast. Prior to this study, the patient was premedicated for contrast
reaction. The contrast mixture used was the lowest dosage possible and still
maintain opacification. The present nephrostomy tube is appreciated in
appropriate positioning. There is mild dilatation of the urinary collecting
system. The catheter was cannulated with an Amplatz guidewire and removed. A
10 French multipurpose drainage catheter was placed into the urinary
collecting system with the tip coped in the pelvis. Appropriate placement
confirmed status post administration of radiopaque contrast. The catheter
was secured with sterile Stat-lock and sterile dressing placed at the site
and the catheter was attached to drainage bag. The patient tolerated the
procedure without complications.
IMPRESSION: Left sided nephrostomy tube exchange as described above.
The patient tolerated the procedure without complications.

## 2015-01-28 NOTE — Discharge Summary (Signed)
PATIENT NAME:  Brittney Adams, Brittney Adams MR#:  [id number] DATE OF BIRTH:  11/22/1932  DATE OF ADMISSION:  12/09/2011 DATE OF DISCHARGE:  12/14/2011  DIAGNOSES: 1. Bilateral hydronephrosis secondary to recurrent and progressing carcinoma of bladder status post percutaneous nephrostomy tube done.  2. Increasing lower abdominal pain secondary to metastatic cancer.  3. Renal failure secondary to bilateral hydronephrosis.   DISCHARGE MEDICATIONS:  1. Gabapentin 300 mg 1 tablet 2 times a day.  2. Fentanyl patch 50 mcg every 72 hours. 3. Vicodin 1 tablet q.4 hours p.r.n. for breakthrough pain.  4. Lorazepam 0.5 mg 3 times a day as needed.  5. Lisinopril 20 mg 1 tablet daily.  6. Multivitamin 1 tablet p.o. daily.  7. Celexa 10 mg p.o. daily.  8. Pravastatin 20 mg p.o. daily.  9. Atenolol 50 mg 1 tablet p.o. daily.  10. Senna S 1 tablet p.o. b.i.Adams. as needed.   DIET: Regular.   ACTIVITY: As tolerated.   DISCHARGE INSTRUCTIONS: The patient is to be followed by home health nurses to teach management of bilateral nephrostomy tubes.   HISTORY OF PRESENT ILLNESS: Brittney Adams is a known patient to me, 81 year old lady who came to the office with increasing lower abdominal pain and persistent nausea. Ultrasound revealed bilateral hydronephrosis. Creatinine was rising. In view of all of these findings, the patient was admitted in the hospital for further evaluation and treatment consideration.   For detailed past history, social and family history, please refer to dictated note on the chart.   IMPORTANT AVAILABLE LAB DATA: On March 5th ultrasound revealed abnormal appearance of bladder with bilateral hydronephrosis. White count 7.2, hemoglobin 12.2, platelet count 328,000, BUN 20, creatinine 1.48. Urinalysis RBCs 1790, white cells 20.   On March 5th hemoglobin was 11.9, BUN was 20, creatinine 1.44.   HOSPITAL COURSE: During the hospital stay the patient was evaluated by nephrologist and radiologist.  Bilateral percutaneous nephrostomy was placed. The patient and family were taught regarding how to take care of the nephrostomy tube. The patient also underwent multiple medications to control pain. Fentanyl patch was started. Neurontin was started. Pain was somewhat under better control. At that time, the patient was discharged home with advice to get follow-up as outpatient. Plan would be to start chemotherapy as outpatient as well as internalize both percutaneous nephrostomy.   OVERALL PROGNOSIS: Guarded because of underlying disease.   ____________________________ Gerome SamJanak K. Wissam Resor, MD [id number] Adams: 12/29/2011 08:10:09 ET T: 12/29/2011 10:20:42 ET JOB#: [id number]  cc: Valarie ConesJanak K. Doylene Canninghoksi, MD, <Dictator> Laddie AquasJANAK K Sarika Baldini MD ELECTRONICALLY SIGNED 12/30/2011 13:50
